# Patient Record
Sex: Female | Born: 1965 | Race: Black or African American | Hispanic: No | Marital: Married
Health system: Midwestern US, Community
[De-identification: ages and names within clinical notes are randomized; demographics above are authoritative.]

---

## 2015-01-18 ENCOUNTER — Inpatient Hospital Stay
Admit: 2015-01-18 | Discharge: 2015-01-18 | Disposition: A | Discharge status: Home / Self Care | Payer: Self-pay | Attending: Emergency Medicine

## 2015-01-18 DIAGNOSIS — S91312A Laceration without foreign body, left foot, initial encounter: Secondary | ICD-10-CM

## 2015-01-18 MED ORDER — DIPHTH,PERTUS(AC)TETANUS VAC(PF) 2.5 LF UNIT-8 MCG-5 LF/0.5 ML INJ
Freq: Once | INTRAMUSCULAR | Status: AC
Start: 2015-01-18 — End: 2015-01-18
  Administered 2015-01-18: 15:00:00 via INTRAMUSCULAR

## 2015-01-18 MED ORDER — HYDROCODONE-ACETAMINOPHEN 5 MG-325 MG TAB
5-325 mg | ORAL_TABLET | Freq: Four times a day (QID) | ORAL | Status: DC | PRN
Start: 2015-01-18 — End: 2015-11-13

## 2015-01-18 MED ORDER — BUPIVACAINE (PF) 0.5 % (5 MG/ML) IJ SOLN
0.5 % (5 mg/mL) | Freq: Once | INTRAMUSCULAR | Status: DC
Start: 2015-01-18 — End: 2015-01-18

## 2015-01-18 MED ORDER — LIDOCAINE (PF) 10 MG/ML (1 %) IJ SOLN
10 mg/mL (1 %) | Freq: Once | INTRAMUSCULAR | Status: DC
Start: 2015-01-18 — End: 2015-01-18

## 2015-01-18 MED FILL — XYLOCAINE-MPF 10 MG/ML (1 %) INJECTION SOLUTION: 10 mg/mL (1 %) | INTRAMUSCULAR | Qty: 5

## 2015-01-18 MED FILL — BOOSTRIX TDAP 2.5 LF UNIT-8 MCG-5 LF/0.5 ML INTRAMUSCULAR SYRINGE: INTRAMUSCULAR | Qty: 1

## 2015-01-18 NOTE — ED Notes (Signed)
Monique Rodgers, [place] reviewed discharge instructions with the patient.  The patient verbalized understanding.  All questions and concerns were addressed.  The patient is discharged ambulatory in the care of family members with instructions and prescriptions in hand.  Pt is alert and oriented x 4.  Respirations are clear and unlabored.   Family present to drive home

## 2015-01-18 NOTE — ED Provider Notes (Signed)
HPI Comments: Monique Rodgers is a 49 y.o. female who presents via EMS to [facility] ED with cc of sudden onset constant 9/10 sharp forefoot pain that radiates to her great toe with associated 1 inch laceration secondary to a salsa jar falling from a store shelf and breaking on her foot just PTA. Per EMS the jar broke and split open the Pt's sneaker. Pt C/O exacerbated pain with weight bearing. Pt denies knowledge of last tetanus vaccination.     PMhx is significant for: no contributory Hx on file  Social hx: - Smoke, - EtOH    There are no other complaints, changes or physical findings at this time.         The history is provided by the patient and the EMS personnel.        No past medical history on file.    No past surgical history on file.      No family history on file.    History     Social History   ??? Marital Status: N/A     Spouse Name: N/A   ??? Number of Children: N/A   ??? Years of Education: N/A     Occupational History   ??? Not on file.     Social History Main Topics   ??? Smoking status: Not on file   ??? Smokeless tobacco: Not on file   ??? Alcohol Use: Not on file   ??? Drug Use: Not on file   ??? Sexual Activity: Not on file     Other Topics Concern   ??? Not on file     Social History Narrative   ??? No narrative on file           ALLERGIES: Review of patient's allergies indicates not on file.      Review of Systems   Constitutional: Negative for fever and chills.   HENT: Negative for congestion and rhinorrhea.    Eyes: Negative for visual disturbance.   Respiratory: Negative for cough and shortness of breath.    Cardiovascular: Negative for chest pain.   Gastrointestinal: Negative for nausea, vomiting and abdominal pain.   Genitourinary: Negative for dysuria and difficulty urinating.   Musculoskeletal: Positive for arthralgias. Negative for back pain and neck pain.   Skin: Positive for wound. Negative for rash.   Neurological: Negative for dizziness and headaches.       Filed Vitals:    01/18/15 1056   BP: 143/88    Pulse: 98   Temp: 98.7 ??F (37.1 ??C)   Resp: 20   Height: 5' (1.524 m)   Weight: 77.111 kg (170 lb)   SpO2: 98%            Physical Exam   Constitutional: She is oriented to person, place, and time. She appears well-developed and well-nourished. No distress.   HENT:   Head: Normocephalic and atraumatic.   Right Ear: External ear normal.   Left Ear: External ear normal.   Nose: Nose normal.   Mouth/Throat: Oropharynx is clear and moist. No oropharyngeal exudate.   Eyes: Conjunctivae and EOM are normal. Pupils are equal, round, and reactive to light. Right eye exhibits no discharge. Left eye exhibits no discharge. No scleral icterus.   Neck: Normal range of motion. Neck supple. No JVD present. No tracheal deviation present.   Cardiovascular: Normal rate, regular rhythm, normal heart sounds and intact distal pulses.  Exam reveals no gallop and no friction rub.    No murmur  heard.  Pulmonary/Chest: Effort normal and breath sounds normal. No respiratory distress. She has no wheezes. She has no rales. She exhibits no tenderness.   Abdominal: Soft. Bowel sounds are normal. She exhibits no distension and no mass. There is no tenderness. There is no rebound and no guarding.   Musculoskeletal: Normal range of motion. She exhibits no edema.   Lymphadenopathy:     She has no cervical adenopathy.   Neurological: She is alert and oriented to person, place, and time. She has normal reflexes. No cranial nerve deficit. She exhibits normal muscle tone. Coordination normal.   Skin: Skin is warm and dry. She is not diaphoretic.   1 cm horizontal laceration over the dorsal aspect of the left MTPJ   Psychiatric: She has a normal mood and affect. Her behavior is normal. Judgment and thought content normal.   Nursing note and vitals reviewed.       MDM  Number of Diagnoses or Management Options  Diagnosis management comments: DDX: laceration, fracture, contusion       Amount and/or Complexity of Data Reviewed   Tests in the radiology section of CPT??: ordered and reviewed  Review and summarize past medical records: yes  Discuss the patient with other providers: yes (EMS  )    Patient Progress  Patient progress: stable      Procedures    Procedure Note - Laceration Repair:  10:53 AM  Procedure by Annita Brod.  Complexity: simple  1 cm linear laceration to left foot  was irrigated copiously with NS under jet lavage, prepped with shur clenz and draped in a sterile fashion.  The area was anesthetized with 3 mL 50/50 mix of Lidocaine 1% without epinephrine and 0.5% bupivacaine via local infiltration.  The wound was explored with the following results: No foreign bodies found.  The wound was repaired with One layer suture closure: Skin Layer:  2 sutures placed, stitch type:1 simple interrupted and 1 horizontal mattress, suture: 4-0 nylon..  The wound was closed with good hemostasis and approximation.  Sterile dressing applied.  Estimated blood loss: < 5 cc  The procedure took 1-15 minutes, and pt tolerated well.      IMAGING RESULTS:  XR FOOT LT MIN 3 V (Final result) Result time: 01/18/15 11:09:37   ?? Final result by Rad Results In Edi (01/18/15 11:09:37)   ?? Narrative:   ?? **Final Report**  ??    ICD Codes / Adm.Diagnosis: 892.0 ??729.5 / Open wound of foot except toe( ??  Pain in limb  Examination: ??CR FOOT MIN 3 VWS LT ??- [id number] - Jan 18 2015 11:03AM  Accession No: ??[id number]  Reason: ??trama 1 mtp      REPORT:  EXAM: ??CR FOOT MIN 3 VWS LT    INDICATION: ?? trama 1 mtp. ??Laceration     COMPARISON: ??None.    FINDINGS: ??Three views of the left foot demonstrate no fracture or other   acute osseous or articular abnormality. ??There is soft tissue swelling   adjacent to the first MTP. ?? ??   ????    IMPRESSION: ??No fracture identified.. ?? ??   ????    ??  ??  Signing/Reading Doctor: Doristine Counter [id number]) ??  ApprovedDoristine Counter [phone]) ??Jan 18 2015 11:07AM ?? ?? ?? ?? ?? ?? ??         MEDICATIONS GIVEN:  Medications    diph,Pertuss(AC),Tet Vac-PF (BOOSTRIX) suspension 0.5 mL (not administered)   bupivacaine (PF) (MARCAINE) 0.5 % (  5 mg/mL) injection 25 mg (not administered)   lidocaine (PF) (XYLOCAINE) 10 mg/mL (1 %) injection 5 mL (not administered)       IMPRESSION:  1. Foot laceration, left, initial encounter    2. Left foot pain        PLAN:  1.   Current Discharge Medication List      START taking these medications    Details   HYDROcodone-acetaminophen (NORCO) 5-325 mg per tablet Take 1 Tab by mouth every six (6) hours as needed for Pain. Max Daily Amount: 4 Tabs.  Qty: 10 Tab, Refills: 0           2.   Follow-up Information     Follow up With Details Comments Contact Info    Cathlean CowerMichael H Brown, MD In 2 days For wound re-check 10 , For suture removal [address]  [address]  [place] [id number]  [phone]          Return to ED if worse       Discharge Note  11:04 AM  The patient is ready for discharge. The patient's signs, symptoms, diagnosis, and discharge instructions have been discussed and the patient has conveyed their understanding. The patient is to follow up as recommended or return to the ER should their symptoms worsen. Plan has been discussed and the patient is in agreement.    This note is prepared by Oswaldo Milianhristopher Karaffa, acting as Scribe for Leggett & PlattPA-C Chris Leonarda Leis    PA-C Nelda Severehris Yuette Putnam: The scribe's documentation has been prepared under my direction and personally reviewed by me in its entirety. I confirm that the note above accurately reflects all work, treatment, procedures, and medical decision making performed by me.

## 2015-01-18 NOTE — ED Notes (Signed)
Non adherant dressing applied to left foot. Covered with a 4x4 gauze and wrapped with a 3 inch ace wrap. Medium cast shoe applied to foot afterward. Pt tolerated dressing. Excellent PMS with brisk cap refill prior to and after placement of dressing and shoe.

## 2015-01-18 NOTE — ED Notes (Signed)
PA at the bedside to suture toe.

## 2015-11-13 ENCOUNTER — Inpatient Hospital Stay
Admit: 2015-11-13 | Discharge: 2015-11-14 | Disposition: A | Discharge status: Home / Self Care | Payer: Self-pay | Attending: Emergency Medicine

## 2015-11-13 DIAGNOSIS — R5383 Other fatigue: Secondary | ICD-10-CM

## 2015-11-13 LAB — CBC WITH AUTOMATED DIFF
ABS. BASOPHILS: 0 10*3/uL (ref 0.0–0.1)
ABS. EOSINOPHILS: 0.1 10*3/uL (ref 0.0–0.4)
ABS. LYMPHOCYTES: 1.3 10*3/uL (ref 0.8–3.5)
ABS. MONOCYTES: 0.3 10*3/uL (ref 0.0–1.0)
ABS. NEUTROPHILS: 1.8 10*3/uL (ref 1.8–8.0)
BASOPHILS: 0 % (ref 0–1)
EOSINOPHILS: 3 % (ref 0–7)
HCT: 35.8 % (ref 35.0–47.0)
HGB: 12.6 g/dL (ref 11.5–16.0)
LYMPHOCYTES: 37 % (ref 12–49)
MCH: 29.4 PG (ref 26.0–34.0)
MCHC: 35.2 g/dL (ref 30.0–36.5)
MCV: 83.6 FL (ref 80.0–99.0)
MONOCYTES: 9 % (ref 5–13)
NEUTROPHILS: 51 % (ref 32–75)
PLATELET: 220 10*3/uL (ref 150–400)
RBC: 4.28 M/uL (ref 3.80–5.20)
RDW: 11.9 % (ref 11.5–14.5)
WBC: 3.6 10*3/uL (ref 3.6–11.0)

## 2015-11-13 LAB — METABOLIC PANEL, COMPREHENSIVE
A-G Ratio: 0.8 — ABNORMAL LOW (ref 1.1–2.2)
ALT (SGPT): 16 U/L (ref 12–78)
AST (SGOT): 23 U/L (ref 15–37)
Albumin: 3.4 g/dL — ABNORMAL LOW (ref 3.5–5.0)
Alk. phosphatase: 86 U/L (ref 45–117)
Anion gap: 8 mmol/L (ref 5–15)
BUN/Creatinine ratio: 7 — ABNORMAL LOW (ref 12–20)
BUN: 7 MG/DL (ref 6–20)
Bilirubin, total: 0.3 MG/DL (ref 0.2–1.0)
CO2: 30 mmol/L (ref 21–32)
Calcium: 8.4 MG/DL — ABNORMAL LOW (ref 8.5–10.1)
Chloride: 107 mmol/L (ref 97–108)
Creatinine: 1 MG/DL (ref 0.55–1.02)
GFR est AA: >60 mL/min/{1.73_m2} (ref >60)
GFR est non-AA: 59 mL/min/{1.73_m2} — ABNORMAL LOW (ref >60)
Globulin: 4.4 g/dL — ABNORMAL HIGH (ref 2.0–4.0)
Glucose: 89 mg/dL (ref 65–100)
Potassium: 3.6 mmol/L (ref 3.5–5.1)
Protein, total: 7.8 g/dL (ref 6.4–8.2)
Sodium: 145 mmol/L (ref 136–145)

## 2015-11-13 LAB — TROPONIN I: Troponin-I, Qt.: <0.04 ng/mL (ref <0.05)

## 2015-11-13 LAB — LIPASE: Lipase: 125 U/L (ref 73–393)

## 2015-11-13 MED ORDER — SODIUM CHLORIDE 0.9% BOLUS IV
0.9 % | Freq: Once | INTRAVENOUS | Status: AC
Start: 2015-11-13 — End: 2015-11-13
  Administered 2015-11-13: 22:00:00 via INTRAVENOUS

## 2015-11-13 MED ORDER — ONDANSETRON (PF) 4 MG/2 ML INJECTION
4 mg/2 mL | INTRAMUSCULAR | Status: AC
Start: 2015-11-13 — End: 2015-11-13
  Administered 2015-11-13: 22:00:00 via INTRAVENOUS

## 2015-11-13 MED ORDER — KETOROLAC TROMETHAMINE 30 MG/ML INJECTION
30 mg/mL (1 mL) | INTRAMUSCULAR | Status: AC
Start: 2015-11-13 — End: 2015-11-13
  Administered 2015-11-13: 22:00:00 via INTRAVENOUS

## 2015-11-13 MED FILL — SODIUM CHLORIDE 0.9 % IV: INTRAVENOUS | Qty: 1000

## 2015-11-13 MED FILL — KETOROLAC TROMETHAMINE 30 MG/ML INJECTION: 30 mg/mL (1 mL) | INTRAMUSCULAR | Qty: 1

## 2015-11-13 MED FILL — ONDANSETRON (PF) 4 MG/2 ML INJECTION: 4 mg/2 mL | INTRAMUSCULAR | Qty: 2

## 2015-11-13 NOTE — ED Notes (Signed)
Pt to restroom before starting fluids.

## 2015-11-13 NOTE — ED Notes (Signed)
Pt received discharge instructions from Dr. Harrie Jeans and verbalized understanding. Pt ambul;atory to exit refused wheelchair.

## 2015-11-13 NOTE — ED Notes (Signed)
Pt to restroom for urine sample

## 2015-11-13 NOTE — ED Triage Notes (Signed)
Pt arrives to ED Triage while talking on her cell phone. When the patient was asked a question she put up her index finger and raised her arm and said to nurse "wait". Pt ambulatory to triage with a steady gait and is in NAD.

## 2015-11-13 NOTE — ED Provider Notes (Signed)
HPI Comments: Monique Rodgers is a 50 y.o. female with no significant medical history who presents ambulatory to [facility] ED with a chief complaint of an intermittent sharp left-sided headache for the past two days with associated dizziness and weakness. Patient reports diarrhea, congestion, and rhinorrhea two days ago, but states those symptoms have since resolved. Patient notes she had a colonoscopy completed while living in [place] a few years ago, and reports it was normal at that time. Patient states she is no longer menstruating. Patient notes she is originally from [place], but denies any recent foreign travel. Patient denies any sick contacts. Patient denies any recent use of antibiotics. Patient denies any history of hypertension, colon disease, or abdominal surgery. Patient denies any neck pain, sore throat, vomiting, vaginal bleeding, hematochezia, fevers, chills, or any other symptoms at this time.    PCP: None on file    There are no other complaints, changes or physical findings at this time.    The history is provided by the patient.        No past medical history on file.    No past surgical history on file.      No family history on file.    Social History     Social History   ??? Marital status: MARRIED     Spouse name: N/A   ??? Number of children: N/A   ??? Years of education: N/A     Occupational History   ??? Not on file.     Social History Main Topics   ??? Smoking status: Not on file   ??? Smokeless tobacco: Not on file   ??? Alcohol use Not on file   ??? Drug use: Not on file   ??? Sexual activity: Not on file     Other Topics Concern   ??? Not on file     Social History Narrative     ALLERGIES: Oxycodone    Review of Systems   Constitutional: Negative for activity change, appetite change, chills and fever.   HENT: Positive for congestion (resolved) and rhinorrhea (resolved). Negative for sinus pressure, sneezing and sore throat.    Eyes: Negative.  Negative for pain, discharge and visual disturbance.    Respiratory: Negative.  Negative for cough and shortness of breath.    Cardiovascular: Negative.  Negative for chest pain.   Gastrointestinal: Positive for diarrhea (resolved). Negative for abdominal pain, nausea and vomiting.   Endocrine: Negative.    Genitourinary: Negative.  Negative for dysuria, flank pain, frequency and urgency.   Musculoskeletal: Negative.  Negative for arthralgias, back pain, gait problem, joint swelling, myalgias and neck pain.   Skin: Negative.  Negative for color change and rash.   Allergic/Immunologic: Negative.    Neurological: Positive for dizziness, weakness (generalized) and headaches (right sided). Negative for speech difficulty, light-headedness and numbness.   Hematological: Negative.    Psychiatric/Behavioral: Negative.  Negative for agitation, behavioral problems and confusion.   All other systems reviewed and are negative.    Patient Vitals for the past 12 hrs:   Temp Pulse Resp BP SpO2   11/13/15 2015 - - - 120/81 94 %   11/13/15 1845 - - - 128/80 98 %   11/13/15 1800 - - - 115/74 97 %   11/13/15 1719 - - - 117/76 98 %   11/13/15 1441 98 ??F (36.7 ??C) 75 18 119/84 99 %      Physical Exam   Constitutional: She is oriented to person, place, and  time. She appears well-developed and well-nourished.   HENT:   Head: Normocephalic.   Eyes: Pupils are equal, round, and reactive to light.   Neck: Normal range of motion. Neck supple. No JVD present. No spinous process tenderness and no muscular tenderness present. No Brudzinski's sign and no Kernig's sign noted.   Cardiovascular: Normal rate, normal heart sounds and intact distal pulses.  Exam reveals no gallop and no friction rub.    No murmur heard.  Pulmonary/Chest: Effort normal and breath sounds normal. No stridor. No respiratory distress. She has no wheezes. She has no rales. She exhibits no tenderness.   Abdominal: Soft. Bowel sounds are normal. She exhibits no distension and  no mass. There is no hepatosplenomegaly. There is no tenderness. There is no rebound, no guarding and no CVA tenderness. No hernia.   Neurological: She is alert and oriented to person, place, and time. She has normal strength. She displays no atrophy and no tremor. No cranial nerve deficit or sensory deficit. She exhibits normal muscle tone. She displays a negative Romberg sign. GCS eye subscore is 4. GCS verbal subscore is 5. GCS motor subscore is 6. She displays no Babinski's sign on the right side. She displays no Babinski's sign on the left side.   Skin: Skin is warm.   Nursing note and vitals reviewed.       MDM  Number of Diagnoses or Management Options  Diarrhea, unspecified type:   Fatigue, unspecified type:   Headache, unspecified headache type:   Diagnosis management comments: DDx: Gastroeneteritis, dehydration, coronary syndrome, ICH, CVA, influenza, UTI, appendicitis, colitis, diverticulitis    Impression/Plan: Patient presents with multiple somatic complaints including diarrhea, general malaise, mild headache. Suspect dehydration related to diarrhea. Otherwise workup normal. Responded well to fluids and Toradol. Non-focal neurological exam.        Amount and/or Complexity of Data Reviewed  Clinical lab tests: ordered and reviewed  Tests in the medicine section of CPT??: ordered and reviewed  Obtain history from someone other than the patient: yes  Review and summarize past medical records: yes  Independent visualization of images, tracings, or specimens: yes    Risk of Complications, Morbidity, and/or Mortality  Presenting problems: moderate  Diagnostic procedures: moderate  Management options: moderate    Patient Progress  Patient progress: stable      Procedures     EKG interpretation: (Preliminary) 5:21 PM  Rhythm: normal sinus rhythm; and regular. Rate (approx.): 65; Axis: normal; PR interval: normal; QRS interval: normal; ST/T wave: normal.    Progress Note:  7:54 PM   Patient feeling better after fluids and Toradol. Tolerating PO.    LABORATORY TESTS:  Recent Results (from the past 12 hour(s))   EKG, 12 LEAD, INITIAL    Collection Time: 11/13/15  5:21 PM   Result Value Ref Range    Ventricular Rate 65 BPM    Atrial Rate 65 BPM    P-R Interval 174 ms    QRS Duration 72 ms    Q-T Interval 386 ms    QTC Calculation (Bezet) 401 ms    Calculated P Axis 46 degrees    Calculated R Axis 11 degrees    Calculated T Axis 24 degrees    Diagnosis       Normal sinus rhythm  Normal ECG  No previous ECGs available     CBC WITH AUTOMATED DIFF    Collection Time: 11/13/15  5:29 PM   Result Value Ref Range    WBC  3.6 3.6 - 11.0 K/uL    RBC 4.28 3.80 - 5.20 M/uL    HGB 12.6 11.5 - 16.0 g/dL    HCT 35.8 35.0 - 47.0 %    MCV 83.6 80.0 - 99.0 FL    MCH 29.4 26.0 - 34.0 PG    MCHC 35.2 30.0 - 36.5 g/dL    RDW 11.9 11.5 - 14.5 %    PLATELET 220 150 - 400 K/uL    NEUTROPHILS 51 32 - 75 %    LYMPHOCYTES 37 12 - 49 %    MONOCYTES 9 5 - 13 %    EOSINOPHILS 3 0 - 7 %    BASOPHILS 0 0 - 1 %    ABS. NEUTROPHILS 1.8 1.8 - 8.0 K/UL    ABS. LYMPHOCYTES 1.3 0.8 - 3.5 K/UL    ABS. MONOCYTES 0.3 0.0 - 1.0 K/UL    ABS. EOSINOPHILS 0.1 0.0 - 0.4 K/UL    ABS. BASOPHILS 0.0 0.0 - 0.1 K/UL   METABOLIC PANEL, COMPREHENSIVE    Collection Time: 11/13/15  5:29 PM   Result Value Ref Range    Sodium 145 136 - 145 mmol/L    Potassium 3.6 3.5 - 5.1 mmol/L    Chloride 107 97 - 108 mmol/L    CO2 30 21 - 32 mmol/L    Anion gap 8 5 - 15 mmol/L    Glucose 89 65 - 100 mg/dL    BUN 7 6 - 20 MG/DL    Creatinine 1.00 0.55 - 1.02 MG/DL    BUN/Creatinine ratio 7 (L) 12 - 20      GFR est AA >60 >60 ml/min/1.44m    GFR est non-AA 59 (L) >60 ml/min/1.767m   Calcium 8.4 (L) 8.5 - 10.1 MG/DL    Bilirubin, total 0.3 0.2 - 1.0 MG/DL    ALT (SGPT) 16 12 - 78 U/L    AST (SGOT) 23 15 - 37 U/L    Alk. phosphatase 86 45 - 117 U/L    Protein, total 7.8 6.4 - 8.2 g/dL    Albumin 3.4 (L) 3.5 - 5.0 g/dL    Globulin 4.4 (H) 2.0 - 4.0 g/dL     A-G Ratio 0.8 (L) 1.1 - 2.2     LIPASE    Collection Time: 11/13/15  5:29 PM   Result Value Ref Range    Lipase 125 73 - 393 U/L   TROPONIN I    Collection Time: 11/13/15  5:29 PM   Result Value Ref Range    Troponin-I, Qt. <0.04 <0.05 ng/mL   URINALYSIS W/ REFLEX CULTURE    Collection Time: 11/13/15  7:04 PM   Result Value Ref Range    Color YELLOW/STRAW      Appearance CLEAR CLEAR      Specific gravity 1.008 1.003 - 1.030      pH (UA) 6.0 5.0 - 8.0      Protein NEGATIVE  NEG mg/dL    Glucose NEGATIVE  NEG mg/dL    Ketone NEGATIVE  NEG mg/dL    Bilirubin NEGATIVE  NEG      Blood NEGATIVE  NEG      Urobilinogen 4.0 (H) 0.2 - 1.0 EU/dL    Nitrites NEGATIVE  NEG      Leukocyte Esterase NEGATIVE  NEG      WBC 0-4 0 - 4 /hpf    RBC 0-5 0 - 5 /hpf    Epithelial cells FEW FEW /lpf  Bacteria NEGATIVE  NEG /hpf    UA:UC IF INDICATED CULTURE NOT INDICATED BY UA RESULT CNI      Hyaline cast 0-2 0 - 5 /lpf       MEDICATIONS GIVEN:  Medications   sodium chloride 0.9 % bolus infusion 1,000 mL (1,000 mL IntraVENous New Bag 11/13/15 1721)   ketorolac (TORADOL) injection 30 mg (30 mg IntraVENous Given 11/13/15 1721)   ondansetron (ZOFRAN) injection 4 mg (4 mg IntraVENous Given 11/13/15 1721)       IMPRESSION:  1. Fatigue, unspecified type    2. Diarrhea, unspecified type    3. Headache, unspecified headache type        PLAN:  1.   Current Discharge Medication List      START taking these medications    Details   ondansetron (ZOFRAN ODT) 4 mg disintegrating tablet Take 1 Tab by mouth every eight (8) hours as needed for Nausea.  Qty: 10 Tab, Refills: 0      naproxen (NAPROSYN) 500 mg tablet Take 1 Tab by mouth two (2) times daily (with meals) for 10 days.  Qty: 20 Tab, Refills: 0           2.   Follow-up Information     Follow up With Details Comments [place] III, DO Call in 2 days  [place]  [facility]  [place] [address]  [phone]       [facility] EMERGENCY DEPT  If symptoms worsen [address]  [place]  [phone]        Return to ED if worse     Discharge Note:  8:00 PM  The patient is ready for discharge. The patient's signs, symptoms, diagnosis, and discharge instructions have been discussed and the patient has conveyed their understanding. The patient is to follow up as recommended or return to the ER should their symptoms worsen. Plan has been discussed and the patient is in agreement.      This note is prepared by Doy Mince, acting as Scribe for Alvie Heidelberg, MD.    Alvie Heidelberg, MD: The scribe's documentation has been prepared under my direction and personally reviewed by me in its entirety. I confirm that the note above accurately reflects all work, treatment, procedures, and medical decision making performed by me.

## 2015-11-13 NOTE — ED Notes (Signed)
Pt arrived ambulatory to room. Pt c/o weakness and headache x 2 days. Pt denies n/vomiting, cough or diarrhea.  Monitor x 2. Call bell within reach and plan of care discussed.

## 2015-11-14 LAB — URINALYSIS W/ REFLEX CULTURE
Bacteria: NEGATIVE /hpf
Bilirubin: NEGATIVE
Blood: NEGATIVE
Glucose: NEGATIVE mg/dL
Ketone: NEGATIVE mg/dL
Leukocyte Esterase: NEGATIVE
Nitrites: NEGATIVE
Protein: NEGATIVE mg/dL
Specific gravity: 1.008 (ref 1.003–1.030)
Urobilinogen: 4 EU/dL — ABNORMAL HIGH (ref 0.2–1.0)
pH (UA): 6 (ref 5.0–8.0)

## 2015-11-14 LAB — EKG, 12 LEAD, INITIAL
Atrial Rate: 65 {beats}/min
Calculated P Axis: 46 degrees
Calculated R Axis: 11 degrees
Calculated T Axis: 24 degrees
Diagnosis: NORMAL
P-R Interval: 174 ms
Q-T Interval: 386 ms
QRS Duration: 72 ms
QTC Calculation (Bezet): 401 ms
Ventricular Rate: 65 {beats}/min

## 2015-11-14 MED ORDER — ONDANSETRON 4 MG TAB, RAPID DISSOLVE
4 mg | ORAL_TABLET | Freq: Three times a day (TID) | ORAL | 0 refills | Status: AC | PRN
Start: 2015-11-14 — End: ?

## 2015-11-14 MED ORDER — NAPROXEN 500 MG TAB
500 mg | ORAL_TABLET | Freq: Two times a day (BID) | ORAL | 0 refills | Status: AC
Start: 2015-11-14 — End: 2015-11-23

## 2016-03-18 ENCOUNTER — Ambulatory Visit: Payer: Self-pay | Admitting: Family Medicine

## 2017-07-26 ENCOUNTER — Ambulatory Visit (HOSPITAL_COMMUNITY)
Admission: EM | Admit: 2017-07-26 | Discharge: 2017-07-26 | Disposition: A | Discharge status: Home / Self Care | Payer: PRIVATE HEALTH INSURANCE | Attending: Family Medicine | Admitting: Family Medicine

## 2017-07-26 ENCOUNTER — Encounter (HOSPITAL_COMMUNITY): Payer: Self-pay | Admitting: Emergency Medicine

## 2017-07-26 ENCOUNTER — Ambulatory Visit (INDEPENDENT_AMBULATORY_CARE_PROVIDER_SITE_OTHER): Payer: PRIVATE HEALTH INSURANCE

## 2017-07-26 DIAGNOSIS — S62641A Nondisplaced fracture of proximal phalanx of left index finger, initial encounter for closed fracture: Secondary | ICD-10-CM

## 2017-07-26 DIAGNOSIS — S62653A Nondisplaced fracture of medial phalanx of left middle finger, initial encounter for closed fracture: Secondary | ICD-10-CM

## 2017-07-26 DIAGNOSIS — M79642 Pain in left hand: Secondary | ICD-10-CM

## 2017-07-26 DIAGNOSIS — S62603A Fracture of unspecified phalanx of left middle finger, initial encounter for closed fracture: Secondary | ICD-10-CM

## 2017-07-26 MED ORDER — TRAMADOL HCL 50 MG PO TABS: 50.0000 mg | ORAL_TABLET | Freq: Four times a day (QID) | ORAL | 0 refills | Status: AC | PRN

## 2017-07-26 MED ORDER — NAPROXEN 375 MG PO TABS: 375.0000 mg | ORAL_TABLET | Freq: Two times a day (BID) | ORAL | 0 refills | Status: AC

## 2017-07-26 NOTE — Discharge Instructions (Signed)
Keep splint on your finger. Elevate your hand. Apply ice off and on to the index finger for the next 2-3 days. Take the medication prescribed for pain. Call the hand specialist for an appointment tomorrow.
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
## 2017-11-20 ENCOUNTER — Other Ambulatory Visit: Payer: Self-pay | Admitting: Occupational Medicine

## 2017-11-20 ENCOUNTER — Ambulatory Visit: Payer: Self-pay

## 2017-11-20 DIAGNOSIS — M545 Low back pain: Secondary | ICD-10-CM

## 2019-10-07 IMAGING — DX DG LUMBAR SPINE COMPLETE 4+V
5 series · 5 of 5 positions shown · non-contrast
Comparison: None.

CLINICAL DATA: The patient suffered a low back injury today bending
over to assist a patient. Initial encounter.

EXAM:
LUMBAR SPINE - COMPLETE 4+ VIEW

[l-spine ap]
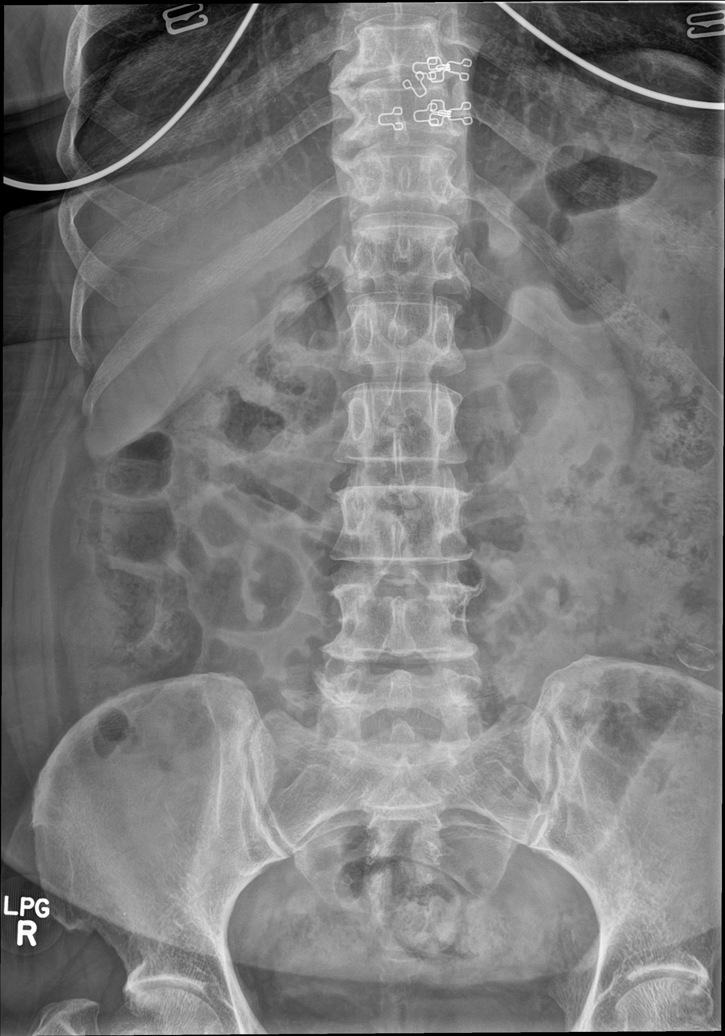

[l-spine obl (1 of 2)]
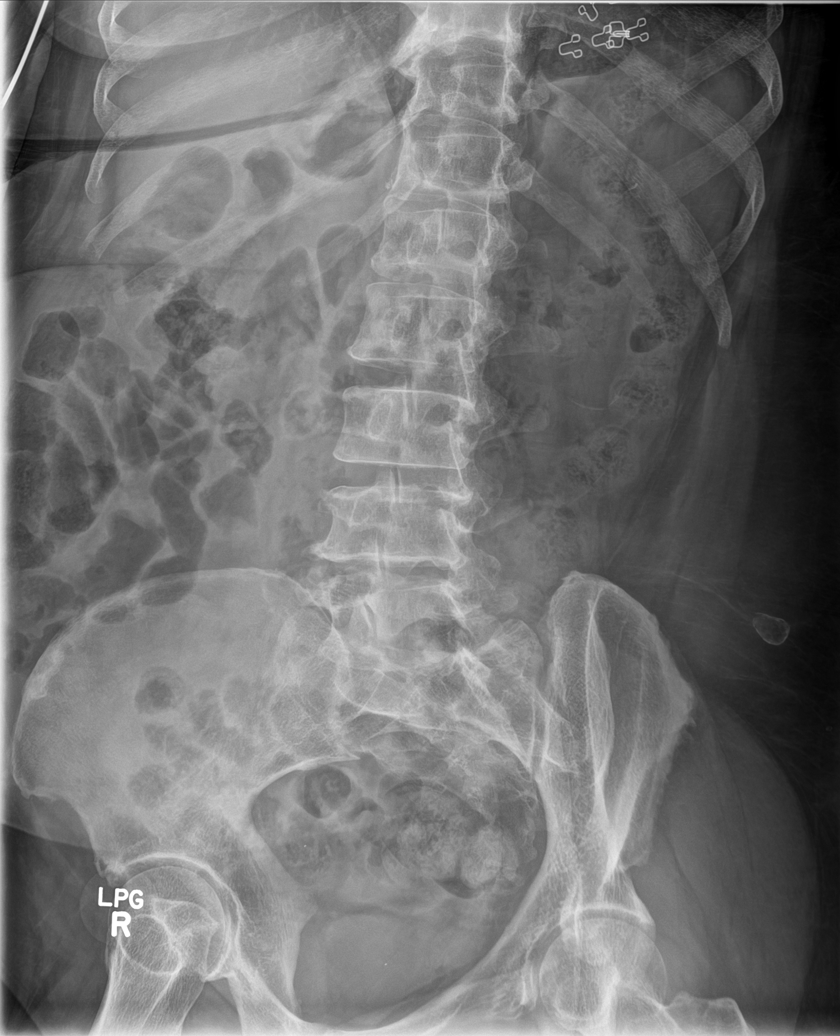

[l-spine obl (2 of 2)]
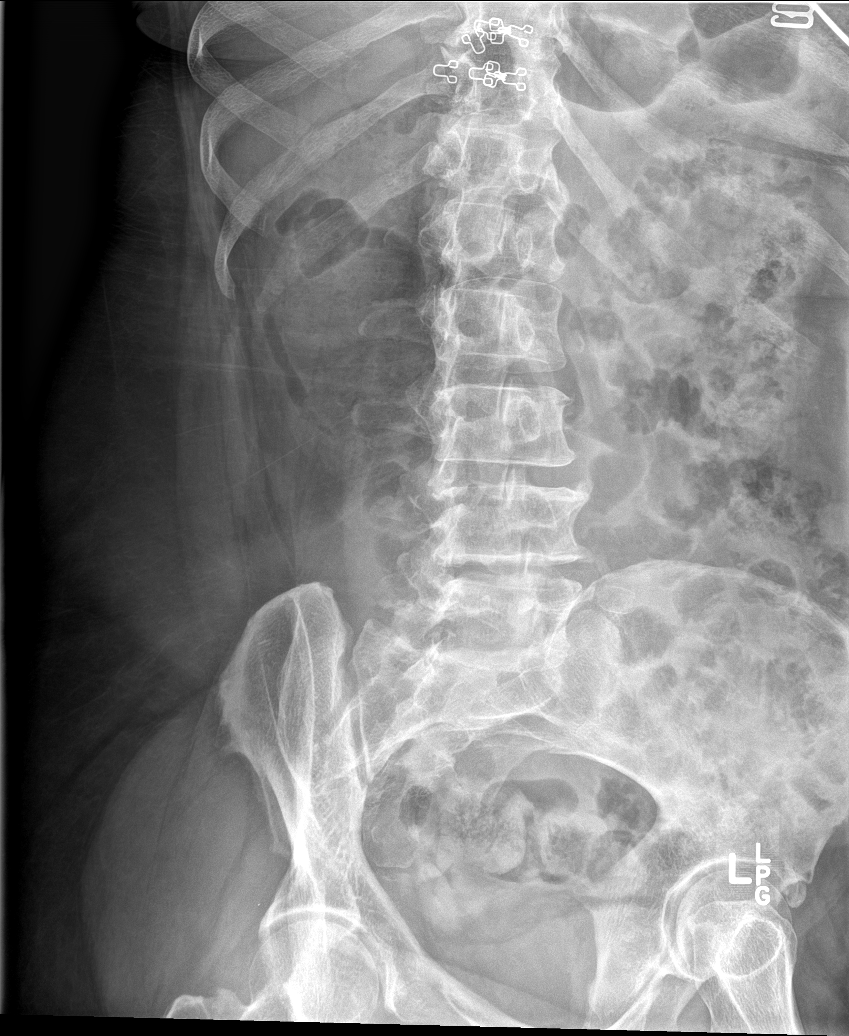

[l-spine lat]
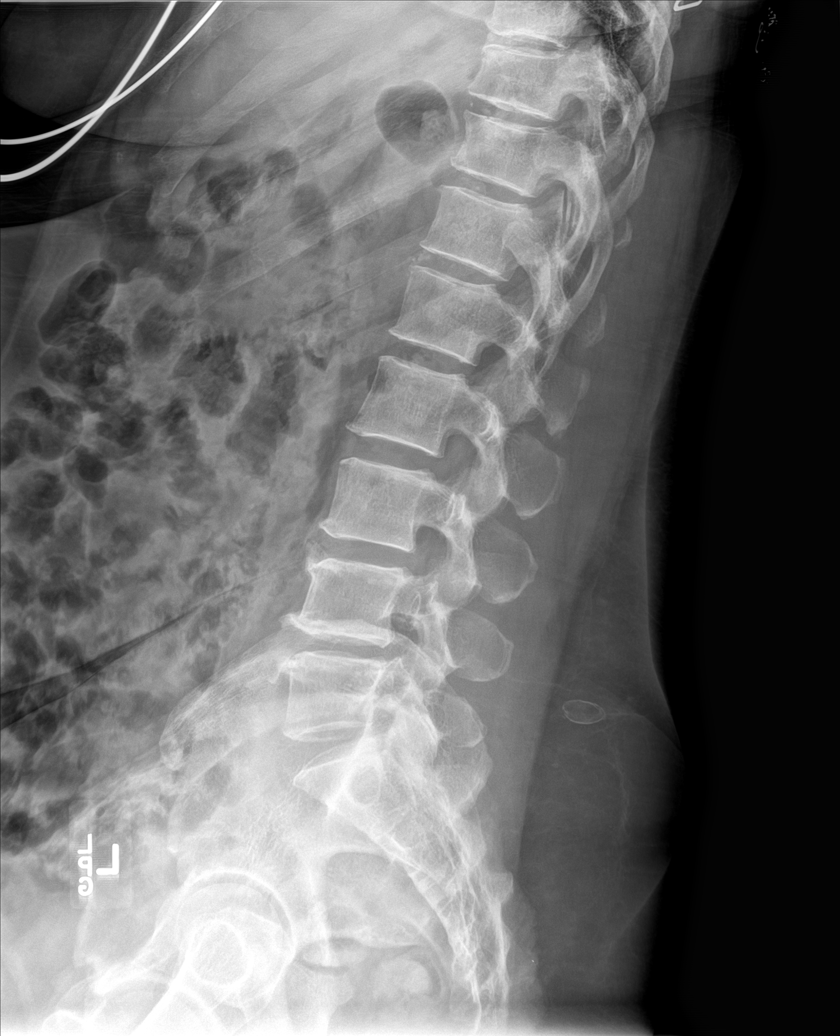

[l-spine spot]
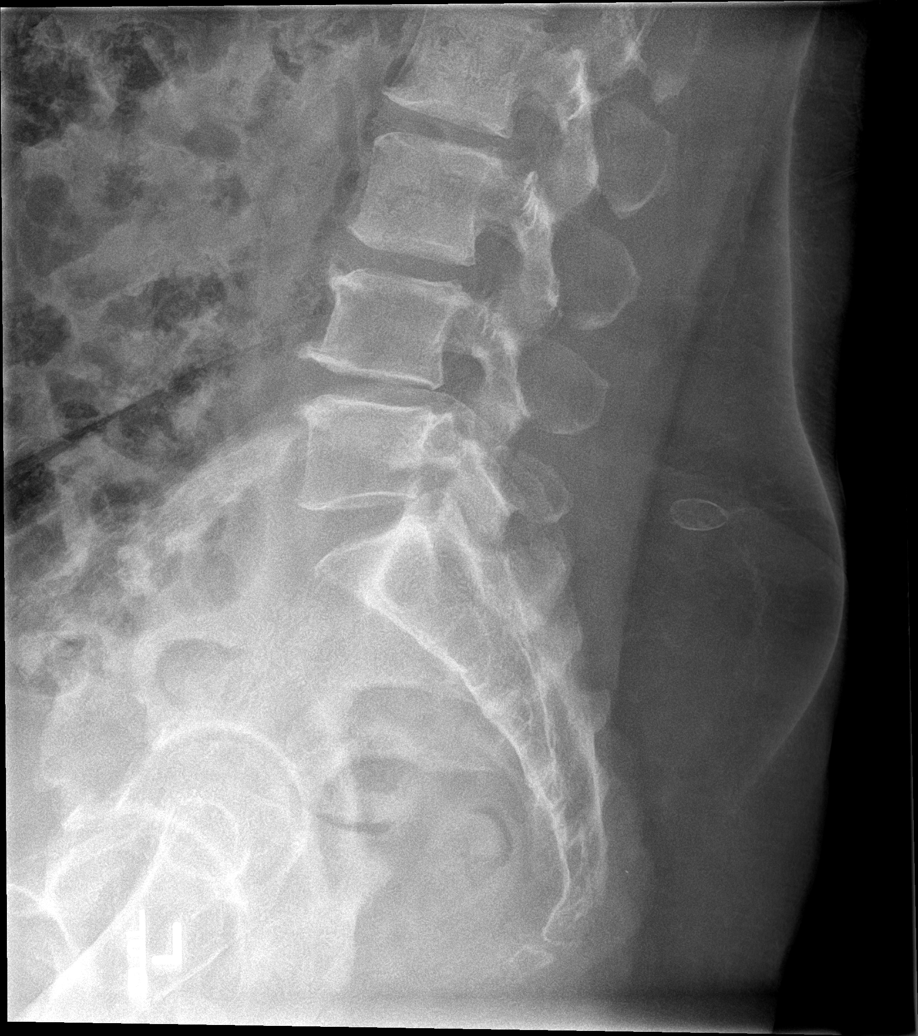

[5 of 5 positions shown; findings below may reference images not displayed]

FINDINGS: No fracture or malalignment. Straightening of lordosis noted. Mild
loss of disc space height and endplate spurring are seen at L4-5.
Paraspinous structures demonstrate a prominent stool burden in the
colon.
IMPRESSION: No acute abnormality.

Mild appearing degenerative disc disease L4-5.

Large colonic stool burden.
